# Patient Record
Sex: Male | Born: 1985 | Race: White | Hispanic: No | Marital: Single | Smoking: Never smoker
Health system: Southern US, Community
[De-identification: ages and names within clinical notes are randomized; demographics above are authoritative.]

---

## 2006-01-11 ENCOUNTER — Emergency Department (HOSPITAL_COMMUNITY): Admission: EM | Admit: 2006-01-11 | Discharge: 2006-01-11 | Payer: Self-pay | Admitting: Emergency Medicine

## 2015-05-01 ENCOUNTER — Emergency Department (HOSPITAL_COMMUNITY)
Admission: EM | Admit: 2015-05-01 | Discharge: 2015-05-01 | Disposition: A | Discharge status: Home / Self Care | Payer: Worker's Compensation | Attending: Emergency Medicine | Admitting: Emergency Medicine

## 2015-05-01 ENCOUNTER — Encounter (HOSPITAL_COMMUNITY): Payer: Self-pay

## 2015-05-01 DIAGNOSIS — S61215A Laceration without foreign body of left ring finger without damage to nail, initial encounter: Secondary | ICD-10-CM | POA: Insufficient documentation

## 2015-05-01 DIAGNOSIS — Y990 Civilian activity done for income or pay: Secondary | ICD-10-CM | POA: Insufficient documentation

## 2015-05-01 DIAGNOSIS — Y9389 Activity, other specified: Secondary | ICD-10-CM | POA: Insufficient documentation

## 2015-05-01 DIAGNOSIS — W268XXA Contact with other sharp object(s), not elsewhere classified, initial encounter: Secondary | ICD-10-CM | POA: Diagnosis not present

## 2015-05-01 DIAGNOSIS — Y9289 Other specified places as the place of occurrence of the external cause: Secondary | ICD-10-CM | POA: Insufficient documentation

## 2015-05-01 DIAGNOSIS — S61412A Laceration without foreign body of left hand, initial encounter: Secondary | ICD-10-CM

## 2015-05-01 DIAGNOSIS — S6991XA Unspecified injury of right wrist, hand and finger(s), initial encounter: Secondary | ICD-10-CM | POA: Diagnosis present

## 2015-05-01 MED ORDER — POVIDONE-IODINE 10 % EX SOLN
CUTANEOUS | Status: AC
  Administered 2015-05-01: 1
  Filled 2015-05-01: qty 118

## 2015-05-01 MED ORDER — LIDOCAINE HCL (PF) 1 % IJ SOLN
5.0000 mL | Freq: Once | INTRAMUSCULAR | Status: AC
  Administered 2015-05-01: 5 mL via INTRADERMAL
  Filled 2015-05-01: qty 5

## 2015-05-01 MED ORDER — BACITRACIN-NEOMYCIN-POLYMYXIN 400-5-5000 EX OINT
TOPICAL_OINTMENT | Freq: Once | CUTANEOUS | Status: AC
  Administered 2015-05-01: 1 via TOPICAL
  Filled 2015-05-01: qty 1

## 2015-05-01 NOTE — Discharge Instructions (Signed)

## 2015-05-01 NOTE — ED Notes (Signed)
Pt states tetanus is up to date.

## 2015-05-01 NOTE — ED Notes (Signed)
Pt cut the tip of the 4th finger of the left hand on sheet metal at work ([employer])  Bleeding controlled at this time

## 2015-05-01 NOTE — ED Notes (Signed)
MD at bedside. 

## 2015-05-01 NOTE — ED Provider Notes (Addendum)
TIME SEEN: 6:15 AM    CHIEF COMPLAINT: Left hand laceration  HPI: Pt is a 29 y.o. right-hand-dominant male with no significant past history who presents to the emergency department with a laceration to the fourth digit on the left hand that occurred 1 hour ago. States that he was at work when he cut his finger on a sheet metal. He is able to fully extend and flex the finger. No loss of sensation. No other injury. Last tetanus vaccination was 2 years ago.  ROS: See HPI Constitutional: no fever  Eyes: no drainage  ENT: no runny nose   Cardiovascular:  no chest pain  Resp: no SOB  GI: no vomiting GU: no dysuria Integumentary: no rash  Allergy: no hives  Musculoskeletal: no leg swelling  Neurological: no slurred speech ROS otherwise negative  PAST MEDICAL HISTORY/PAST SURGICAL HISTORY:  History reviewed. No pertinent past medical history.  MEDICATIONS:  Prior to Admission medications   Medication Sig Start Date End Date Taking? Authorizing Provider  diphenhydrAMINE (SOMINEX) 25 MG tablet Take 25 mg by mouth at bedtime as needed for sleep.   Yes Historical Provider, MD  ibuprofen (ADVIL,MOTRIN) 200 MG tablet Take 200 mg by mouth every 6 (six) hours as needed.   Yes Historical Provider, MD    ALLERGIES:  No Known Allergies  SOCIAL HISTORY:  Social History  Substance Use Topics  . Smoking status: Never Smoker   . Smokeless tobacco: Not on file  . Alcohol Use: No    FAMILY HISTORY: No family history on file.  EXAM: BP 149/99 mmHg  Pulse 89  Temp(Src) 98.3 F (36.8 C) (Oral)  Resp 16  Ht 5\' 10"  (1.778 m)  Wt 170 lb (77.111 kg)  BMI 24.39 kg/m2  SpO2 100% CONSTITUTIONAL: Alert and oriented and responds appropriately to questions. Well-appearing; well-nourished HEAD: Normocephalic EYES: Conjunctivae clear, PERRL ENT: normal nose; no rhinorrhea; moist mucous membranes; pharynx without lesions noted NECK: Supple, no meningismus, no LAD  CARD: RRR; S1 and S2 appreciated;  no murmurs, no clicks, no rubs, no gallops RESP: Normal chest excursion without splinting or tachypnea; breath sounds clear and equal bilaterally; no wheezes, no rhonchi, no rales, no hypoxia or respiratory distress, speaking full sentences ABD/GI: Normal bowel sounds; non-distended; soft, non-tender, no rebound, no guarding, no peritoneal signs BACK:  The back appears normal and is non-tender to palpation, there is no CVA tenderness EXT: Laceration to the left fourth digit with full range of motion in this finger with no bony deformity, normal sensation diffusely throughout this hand, 2+ radial pulse on the left, Normal ROM in all joints; otherwise extremities are non-tender to palpation; no edema; normal capillary refill; no cyanosis, no calf tenderness or swelling    SKIN: Normal color for age and race; warm; 5 cm curvilinear laceration to the ulnar aspect of the left fourth digit between the DIP and PIP without tendon involvement or bony deformity NEURO: Moves all extremities equally, sensation to light touch intact diffusely, cranial nerves II through XII intact PSYCH: The patient's mood and manner are appropriate. Grooming and personal hygiene are appropriate.  MEDICAL DECISION MAKING: Patient here with left fourth digit laceration. Tetanus is up-to-date. I have repaired patient's laceration using ten 4-0 prolene sutures. Discussed return precautions. Discussed wound care. He verbalizes understanding and is comfortable with this plan.     LACERATION REPAIR Performed by: Raelyn NumberWARD, Brae Gartman N Authorized by: Raelyn NumberWARD, Whitt Auletta N Consent: Verbal consent obtained. Risks and benefits: risks, benefits and alternatives were discussed  Consent given by: patient Patient identity confirmed: provided demographic data Prepped and Draped in normal sterile fashion Wound explored  Laceration Location: Left fourth finger  Laceration Length: 5 cm  No Foreign Bodies seen or palpated  Anesthesia: local  infiltration  Local anesthetic: lidocaine 1 % without epinephrine  Anesthetic total: 4 ml  Irrigation method: syringe Amount of cleaning: standard  Skin closure: Superficial   Number of sutures: 10   Technique: Area anesthetized using 1% lidocaine without epinephrine by digital block. Cleaned copiously with 1 L of saline and Betadine. Prepped and draped in sterile fashion. Wound repaired using 10 superficial simple interrupted 4-0 Prolene sutures. Hemostasis achieved. Bacitracin and sterile dressing applied.   Patient tolerance: Patient tolerated the procedure well with no immediate complications.    NERVE BLOCK Performed by: Raelyn Number Consent: Verbal consent obtained. Required items: required blood products, implants, devices, and special equipment available Time out: Immediately prior to procedure a "time out" was called to verify the correct patient, procedure, equipment, support staff and site/side marked as required.  Indication: Laceration repair  Nerve block body site: Left fourth digit   Preparation: Patient was prepped and draped in the usual sterile fashion. Needle gauge: 24 G Location technique: anatomical landmarks  Local anesthetic: 1% lidocaine without epinephrine   Anesthetic total: 4 ml  Outcome: pain improved Patient tolerance: Patient tolerated the procedure well with no immediate complications.      Layla Maw Cornell Gaber, DO 05/01/15 0719  Layla Maw Conlin Brahm, DO 05/01/15 1610

## 2015-11-04 ENCOUNTER — Telehealth: Payer: Self-pay | Admitting: Geriatric Medicine

## 2015-11-04 NOTE — Telephone Encounter (Signed)
Patient needs refills on his Adderalls, ok? Please advise.

## 2015-11-04 NOTE — Telephone Encounter (Signed)
This Derek Tran is not a pt here -?wrong chart Thx

## 2015-11-05 NOTE — Telephone Encounter (Signed)
Sorry, I selected the wrong patient. I will send the correct one.

## 2017-04-26 ENCOUNTER — Ambulatory Visit (INDEPENDENT_AMBULATORY_CARE_PROVIDER_SITE_OTHER): Payer: Self-pay | Admitting: Orthopaedic Surgery

## 2017-04-26 ENCOUNTER — Encounter (INDEPENDENT_AMBULATORY_CARE_PROVIDER_SITE_OTHER): Payer: Self-pay

## 2017-04-26 ENCOUNTER — Ambulatory Visit (INDEPENDENT_AMBULATORY_CARE_PROVIDER_SITE_OTHER): Payer: Self-pay

## 2017-04-26 ENCOUNTER — Encounter (INDEPENDENT_AMBULATORY_CARE_PROVIDER_SITE_OTHER): Payer: Self-pay | Admitting: Orthopaedic Surgery

## 2017-04-26 VITALS — BP 155/103 | HR 96 | Resp 16 | Ht 70.0 in | Wt 175.0 lb

## 2017-04-26 DIAGNOSIS — M25571 Pain in right ankle and joints of right foot: Secondary | ICD-10-CM

## 2017-04-26 NOTE — Progress Notes (Signed)
Office Visit Note   Patient: Derek Tran           Date of Birth: 07-31-1985           MRN: [id number] Visit Date: 04/26/2017              Requested by: No referring provider defined for this encounter. PCP: Patient, No Pcp Per   Assessment & Plan: Visit Diagnoses:  1. Pain in right ankle and joints of right foot     Plan: Sprain right midfoot with possible ligamentous avulsion talus. Long discussion regarding different treatment options. Because of his insurance status she preferred just use a regular shoe and crutches. We will give him a note regarding no work until November 9.  Follow-Up Instructions: Return if symptoms worsen or fail to improve.   Orders:  Orders Placed This Encounter  Procedures  . XR Foot Complete Right   No orders of the defined types were placed in this encounter.     Procedures: No procedures performed   Clinical Data: No additional findings.   Subjective: Chief Complaint  Patient presents with  . Right Ankle - Pain, Edema    Derek Tran is a 31 y o that presents with a Right ankle sprain/? He missed the last step and rolled foot laterally- heard a loud "pop" on October 18,2018.  She had a [facility] emergency room last Thursday after the injury. X-rays were negative for any fracture. Films of the ankle. Resume having pain predominantly in the dorsum of his right foot. He did describe an injury where there appeared to be an inversion stress to his ankle as he was using stairs in his home. Still having some difficulty bearing weight because of pain in the midfoot. No numbness or tingling  HPI  Review of Systems  Constitutional: Negative for fatigue.  HENT: Negative for hearing loss.   Respiratory: Negative for apnea, chest tightness and shortness of breath.   Cardiovascular: Negative for chest pain, palpitations and leg swelling.  Gastrointestinal: Negative for blood in stool, constipation and diarrhea.  Genitourinary: Negative  for difficulty urinating.  Musculoskeletal: Positive for arthralgias and joint swelling. Negative for back pain, myalgias, neck pain and neck stiffness.  Neurological: Positive for headaches. Negative for weakness and numbness.  Hematological: Does not bruise/bleed easily.  Psychiatric/Behavioral: Positive for sleep disturbance. The patient is not nervous/anxious.      Objective: Vital Signs: BP (!) 155/103   Pulse 96   Resp 16   Ht 5\' 10"  (1.778 m)   Wt 175 lb (79.4 kg)   BMI 25.11 kg/m   Physical Exam  Ortho Exam right foot without any ankle pain or swelling or ecchymosis. No pain over either malleolus and. No pain over the anterior talofibular and fibulocalcaneal ligaments or deltoid ligament.  Moderate edema in the dorsum of the foot was some local tenderness in the midfoot more lateral than medial. Next resolving ecchymosis of all the toes but without pain. Skin otherwise intact. No plantar pain. No Achilles pain.  Specialty Comments:  No specialty comments available.  Imaging: Xr Foot Complete Right  Result Date: 04/26/2017 Films of the right foot were obtained in 3 projections as his pain now is localized to the dorsum of his foot. Compared to the films on the PACS system from the right ankle where there is an os perineum. No obvious fracture. Tarsal joints are intact. No soft tissue calcification except the dorsum of the distal talus which could represent  a small avulsion fracture    PMFS History: There are no active problems to display for this patient.  No past medical history on file.  No family history on file.  No past surgical history on file. Social History   Occupational History  . Not on file.   Social History Main Topics  . Smoking status: Never Smoker  . Smokeless tobacco: Never Used  . Alcohol use No  . Drug use: No  . Sexual activity: Not on file

## 2017-08-05 ENCOUNTER — Emergency Department (HOSPITAL_COMMUNITY)
Admission: EM | Admit: 2017-08-05 | Discharge: 2017-08-05 | Disposition: A | Discharge status: Home / Self Care | Payer: Self-pay | Attending: Emergency Medicine | Admitting: Emergency Medicine

## 2017-08-05 ENCOUNTER — Emergency Department (HOSPITAL_COMMUNITY): Payer: Self-pay

## 2017-08-05 ENCOUNTER — Other Ambulatory Visit: Payer: Self-pay

## 2017-08-05 ENCOUNTER — Encounter (HOSPITAL_COMMUNITY): Payer: Self-pay

## 2017-08-05 DIAGNOSIS — G8929 Other chronic pain: Secondary | ICD-10-CM | POA: Insufficient documentation

## 2017-08-05 DIAGNOSIS — M25571 Pain in right ankle and joints of right foot: Secondary | ICD-10-CM | POA: Insufficient documentation

## 2017-08-05 DIAGNOSIS — Z79899 Other long term (current) drug therapy: Secondary | ICD-10-CM | POA: Insufficient documentation

## 2017-08-05 NOTE — ED Provider Notes (Signed)
[facility] EMERGENCY DEPARTMENT Provider Note   CSN: [id number] Arrival date & time: 08/05/17  [id number]     History   Chief Complaint Chief Complaint  Patient presents with  . Foot Pain    RIGHT    HPI Derek Tran is a 32 y.o. male.  HPI Patient presents to the emergency room for evaluation of persistent ankle and foot pain.  Patient states he rolled his ankle about 3 months ago when he was carrying his child.  He heard a pop.  He was evaluated at [facility] at the time of the injury and was told the x-rays were negative.  When the patient initially injured his ankle he did ended up developing a large amount of bruising and swelling.  He ended up following up with an orthopedic doctor.  He was instructed that he had a severe ankle sprain.  Patient states his symptoms improved but he continues to have pain and discomfort in his ankles especially when walking a lot.  He denies any recurrent injuries. History reviewed. No pertinent past medical history.  There are no active problems to display for this patient.   History reviewed. No pertinent surgical history.     Home Medications    Prior to Admission medications   Medication Sig Start Date End Date Taking? Authorizing Provider  diphenhydrAMINE (SOMINEX) 25 MG tablet Take 25 mg by mouth at bedtime as needed for sleep.    [provider]  ibuprofen (ADVIL,MOTRIN) 200 MG tablet Take 200 mg by mouth every 6 (six) hours as needed.    [provider]    Family History No family history on file.  Social History Social History   Tobacco Use  . Smoking status: Never Smoker  . Smokeless tobacco: Never Used  Substance Use Topics  . Alcohol use: No  . Drug use: No     Allergies   Patient has no known allergies.   Review of Systems Review of Systems  All other systems reviewed and are negative.    Physical Exam Updated Vital Signs BP (!) 160/97 (BP Location: Right Arm)   Pulse (!) 102    Temp 98.2 F (36.8 C) (Oral)   Resp 18   Ht 1.778 m (5\' 10" )   Wt 77.1 kg (170 lb)   SpO2 98%   BMI 24.39 kg/m   Physical Exam  Constitutional: He appears well-developed and well-nourished. No distress.  HENT:  Head: Normocephalic and atraumatic.  Right Ear: External ear normal.  Left Ear: External ear normal.  Eyes: Conjunctivae are normal. Right eye exhibits no discharge. Left eye exhibits no discharge. No scleral icterus.  Neck: Neck supple. No tracheal deviation present.  Cardiovascular: Normal rate.  Pulmonary/Chest: Effort normal. No stridor. No respiratory distress.  Abdominal: He exhibits no distension.  Musculoskeletal: He exhibits no edema or deformity.       Right knee: Normal.       Right ankle: Tenderness. No lateral malleolus, no medial malleolus, no head of 5th metatarsal and no proximal fibula tenderness found.       Right foot: There is tenderness and bony tenderness. There is no swelling and no deformity.  Neurological: He is alert. Cranial nerve deficit: no gross deficits.  Skin: Skin is warm and dry. No rash noted.  Psychiatric: He has a normal mood and affect.  Nursing note and vitals reviewed.    ED Treatments / Results    Radiology Dg Ankle Complete Right  Result Date: 08/05/2017  CLINICAL DATA:  Right foot/ankle injury 3 months ago EXAM: RIGHT ANKLE - COMPLETE 3+ VIEW COMPARISON:  04/20/2017 FINDINGS: No fracture or dislocation is seen. The ankle mortise is intact. The base of the fifth metatarsal is unremarkable. IMPRESSION: Negative. Electronically Signed   By: Charline Bills M.D.   On: 08/05/2017 09:43   Dg Foot Complete Right  Result Date: 08/05/2017 CLINICAL DATA:  Right foot/ankle injury 3 months ago EXAM: RIGHT FOOT COMPLETE - 3+ VIEW COMPARISON:  None. FINDINGS: No fracture or dislocation is seen. The joint spaces are preserved. Visualized soft tissues are within normal limits. IMPRESSION: Negative. Electronically Signed   By: Charline Bills  M.D.   On: 08/05/2017 09:42    Procedures Procedures (including critical care time)  Medications Ordered in ED Medications - No data to display   Initial Impression / Assessment and Plan / ED Course  I have reviewed the triage vital signs and the nursing notes.  Pertinent labs & imaging results that were available during my care of the patient were reviewed by me and considered in my medical decision making (see chart for details).   Patient has been having persistent ankle pain after an injury several months ago.  No evidence of swelling or abnormality on exam.  X-rays do not show any evidence of occult fracture.  Patient may be having persistent pain from ankle instability associated with his prior sprain.  I will give the patient an ankle brace.  I recommend he contact his orthopedic doctor for further treatment and evaluation. Final Clinical Impressions(s) / ED Diagnoses   Final diagnoses:  Chronic pain of right ankle    ED Discharge Orders    None       Linwood Dibbles, MD 08/05/17 1026

## 2017-08-05 NOTE — ED Triage Notes (Signed)
Patient reports of rolling ankle/foot 3 months ago was seen at [facility]/ then ortho and told no fx. States walking at work is making pain worse. Denies new injury.

## 2017-08-05 NOTE — Discharge Instructions (Signed)
Take over-the-counter medications as needed for pain, wear the ankle brace to help support your ankle, follow-up with your orthopedic doctor as we discussed for further treatment and evaluation

## 2017-09-06 DIAGNOSIS — S93491A Sprain of other ligament of right ankle, initial encounter: Secondary | ICD-10-CM | POA: Diagnosis not present

## 2017-09-06 DIAGNOSIS — M79671 Pain in right foot: Secondary | ICD-10-CM | POA: Diagnosis not present

## 2017-09-19 DIAGNOSIS — E782 Mixed hyperlipidemia: Secondary | ICD-10-CM | POA: Diagnosis not present

## 2017-09-19 DIAGNOSIS — I10 Essential (primary) hypertension: Secondary | ICD-10-CM | POA: Diagnosis not present

## 2017-09-19 DIAGNOSIS — Z6827 Body mass index (BMI) 27.0-27.9, adult: Secondary | ICD-10-CM | POA: Diagnosis not present

## 2017-10-04 DIAGNOSIS — M79671 Pain in right foot: Secondary | ICD-10-CM | POA: Diagnosis not present

## 2017-10-04 DIAGNOSIS — S93491D Sprain of other ligament of right ankle, subsequent encounter: Secondary | ICD-10-CM | POA: Diagnosis not present

## 2017-11-09 DIAGNOSIS — S93491D Sprain of other ligament of right ankle, subsequent encounter: Secondary | ICD-10-CM | POA: Diagnosis not present

## 2017-11-09 DIAGNOSIS — M79671 Pain in right foot: Secondary | ICD-10-CM | POA: Diagnosis not present

## 2017-11-14 DIAGNOSIS — M79671 Pain in right foot: Secondary | ICD-10-CM | POA: Diagnosis not present

## 2017-11-16 DIAGNOSIS — S93491D Sprain of other ligament of right ankle, subsequent encounter: Secondary | ICD-10-CM | POA: Diagnosis not present

## 2017-11-16 DIAGNOSIS — M79671 Pain in right foot: Secondary | ICD-10-CM | POA: Diagnosis not present

## 2017-12-07 DIAGNOSIS — G5750 Tarsal tunnel syndrome, unspecified lower limb: Secondary | ICD-10-CM | POA: Diagnosis not present

## 2017-12-07 DIAGNOSIS — M79671 Pain in right foot: Secondary | ICD-10-CM | POA: Diagnosis not present

## 2018-01-09 DIAGNOSIS — Z6826 Body mass index (BMI) 26.0-26.9, adult: Secondary | ICD-10-CM | POA: Diagnosis not present

## 2018-01-09 DIAGNOSIS — M79671 Pain in right foot: Secondary | ICD-10-CM | POA: Diagnosis not present

## 2018-02-19 DIAGNOSIS — S93491A Sprain of other ligament of right ankle, initial encounter: Secondary | ICD-10-CM | POA: Diagnosis not present

## 2018-02-19 DIAGNOSIS — M7661 Achilles tendinitis, right leg: Secondary | ICD-10-CM | POA: Diagnosis not present

## 2018-02-19 DIAGNOSIS — M79671 Pain in right foot: Secondary | ICD-10-CM | POA: Diagnosis not present

## 2018-02-21 DIAGNOSIS — R2689 Other abnormalities of gait and mobility: Secondary | ICD-10-CM | POA: Diagnosis not present

## 2018-02-21 DIAGNOSIS — M7661 Achilles tendinitis, right leg: Secondary | ICD-10-CM | POA: Diagnosis not present

## 2018-02-28 DIAGNOSIS — R2689 Other abnormalities of gait and mobility: Secondary | ICD-10-CM | POA: Diagnosis not present

## 2018-02-28 DIAGNOSIS — M7661 Achilles tendinitis, right leg: Secondary | ICD-10-CM | POA: Diagnosis not present

## 2018-03-01 DIAGNOSIS — R2689 Other abnormalities of gait and mobility: Secondary | ICD-10-CM | POA: Diagnosis not present

## 2018-03-01 DIAGNOSIS — M7661 Achilles tendinitis, right leg: Secondary | ICD-10-CM | POA: Diagnosis not present

## 2018-03-06 DIAGNOSIS — R2689 Other abnormalities of gait and mobility: Secondary | ICD-10-CM | POA: Diagnosis not present

## 2018-03-06 DIAGNOSIS — M7661 Achilles tendinitis, right leg: Secondary | ICD-10-CM | POA: Diagnosis not present

## 2018-03-13 DIAGNOSIS — M7661 Achilles tendinitis, right leg: Secondary | ICD-10-CM | POA: Diagnosis not present

## 2018-03-13 DIAGNOSIS — R2689 Other abnormalities of gait and mobility: Secondary | ICD-10-CM | POA: Diagnosis not present

## 2018-03-26 DIAGNOSIS — M7661 Achilles tendinitis, right leg: Secondary | ICD-10-CM | POA: Diagnosis not present

## 2018-03-26 DIAGNOSIS — R2689 Other abnormalities of gait and mobility: Secondary | ICD-10-CM | POA: Diagnosis not present

## 2018-03-29 DIAGNOSIS — R2689 Other abnormalities of gait and mobility: Secondary | ICD-10-CM | POA: Diagnosis not present

## 2018-03-29 DIAGNOSIS — M7661 Achilles tendinitis, right leg: Secondary | ICD-10-CM | POA: Diagnosis not present

## 2018-04-13 DIAGNOSIS — M7661 Achilles tendinitis, right leg: Secondary | ICD-10-CM | POA: Diagnosis not present

## 2018-04-13 DIAGNOSIS — R2689 Other abnormalities of gait and mobility: Secondary | ICD-10-CM | POA: Diagnosis not present

## 2018-04-18 DIAGNOSIS — R2689 Other abnormalities of gait and mobility: Secondary | ICD-10-CM | POA: Diagnosis not present

## 2018-04-18 DIAGNOSIS — M7661 Achilles tendinitis, right leg: Secondary | ICD-10-CM | POA: Diagnosis not present

## 2018-04-20 DIAGNOSIS — M7661 Achilles tendinitis, right leg: Secondary | ICD-10-CM | POA: Diagnosis not present

## 2018-04-20 DIAGNOSIS — R2689 Other abnormalities of gait and mobility: Secondary | ICD-10-CM | POA: Diagnosis not present

## 2018-04-23 DIAGNOSIS — M25571 Pain in right ankle and joints of right foot: Secondary | ICD-10-CM | POA: Diagnosis not present

## 2018-04-23 DIAGNOSIS — S93491A Sprain of other ligament of right ankle, initial encounter: Secondary | ICD-10-CM | POA: Diagnosis not present

## 2018-04-23 DIAGNOSIS — M79671 Pain in right foot: Secondary | ICD-10-CM | POA: Diagnosis not present

## 2018-04-23 DIAGNOSIS — M7661 Achilles tendinitis, right leg: Secondary | ICD-10-CM | POA: Diagnosis not present

## 2018-05-03 DIAGNOSIS — M25571 Pain in right ankle and joints of right foot: Secondary | ICD-10-CM | POA: Diagnosis not present

## 2018-05-09 DIAGNOSIS — M25571 Pain in right ankle and joints of right foot: Secondary | ICD-10-CM | POA: Diagnosis not present

## 2018-05-09 DIAGNOSIS — D7589 Other specified diseases of blood and blood-forming organs: Secondary | ICD-10-CM | POA: Diagnosis not present

## 2018-06-27 IMAGING — DX DG FOOT COMPLETE 3+V*R*
3 series · 3 of 3 positions shown · non-contrast
Comparison: None.

CLINICAL DATA: Right foot/ankle injury 3 months ago

EXAM:
RIGHT FOOT COMPLETE - 3+ VIEW

[foot ap]
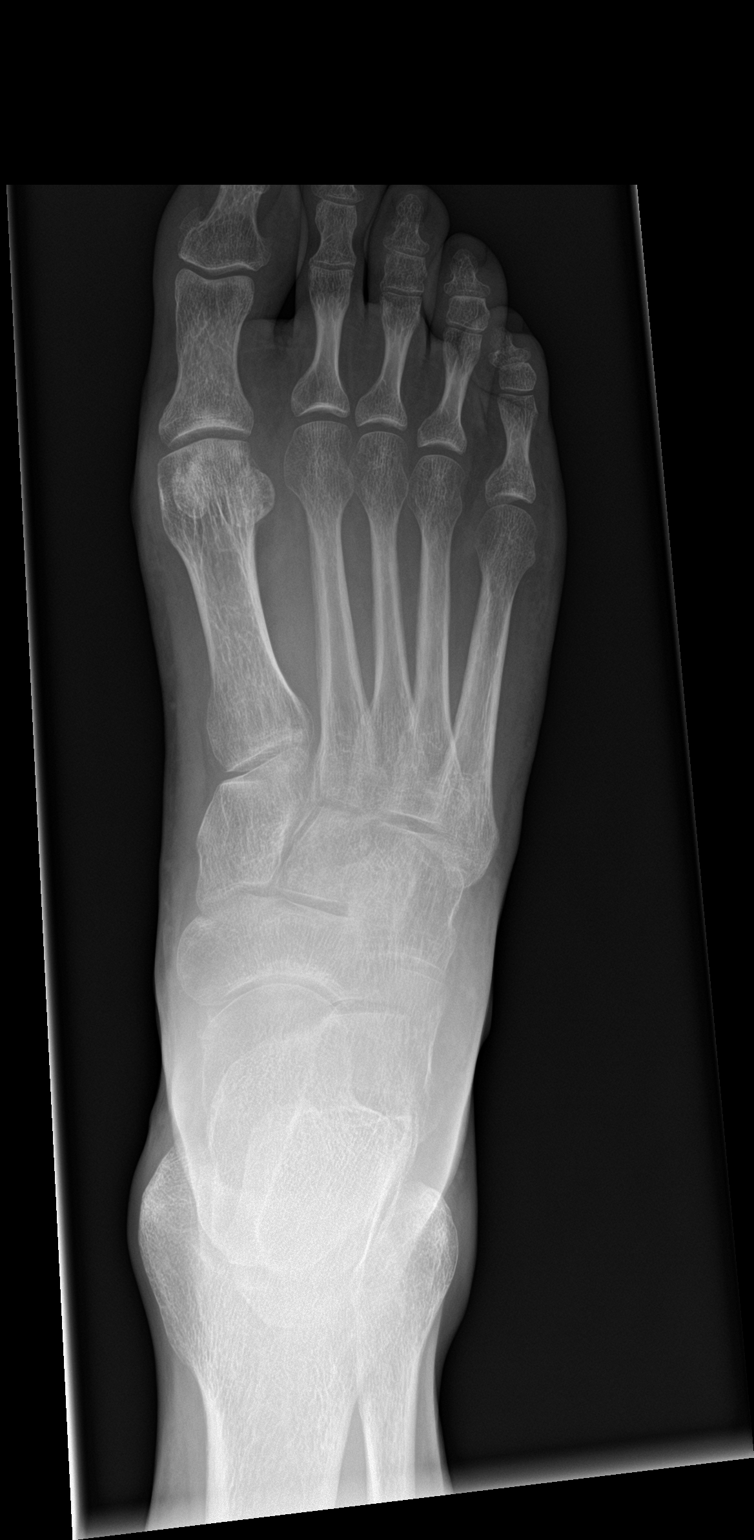

[foot obl]
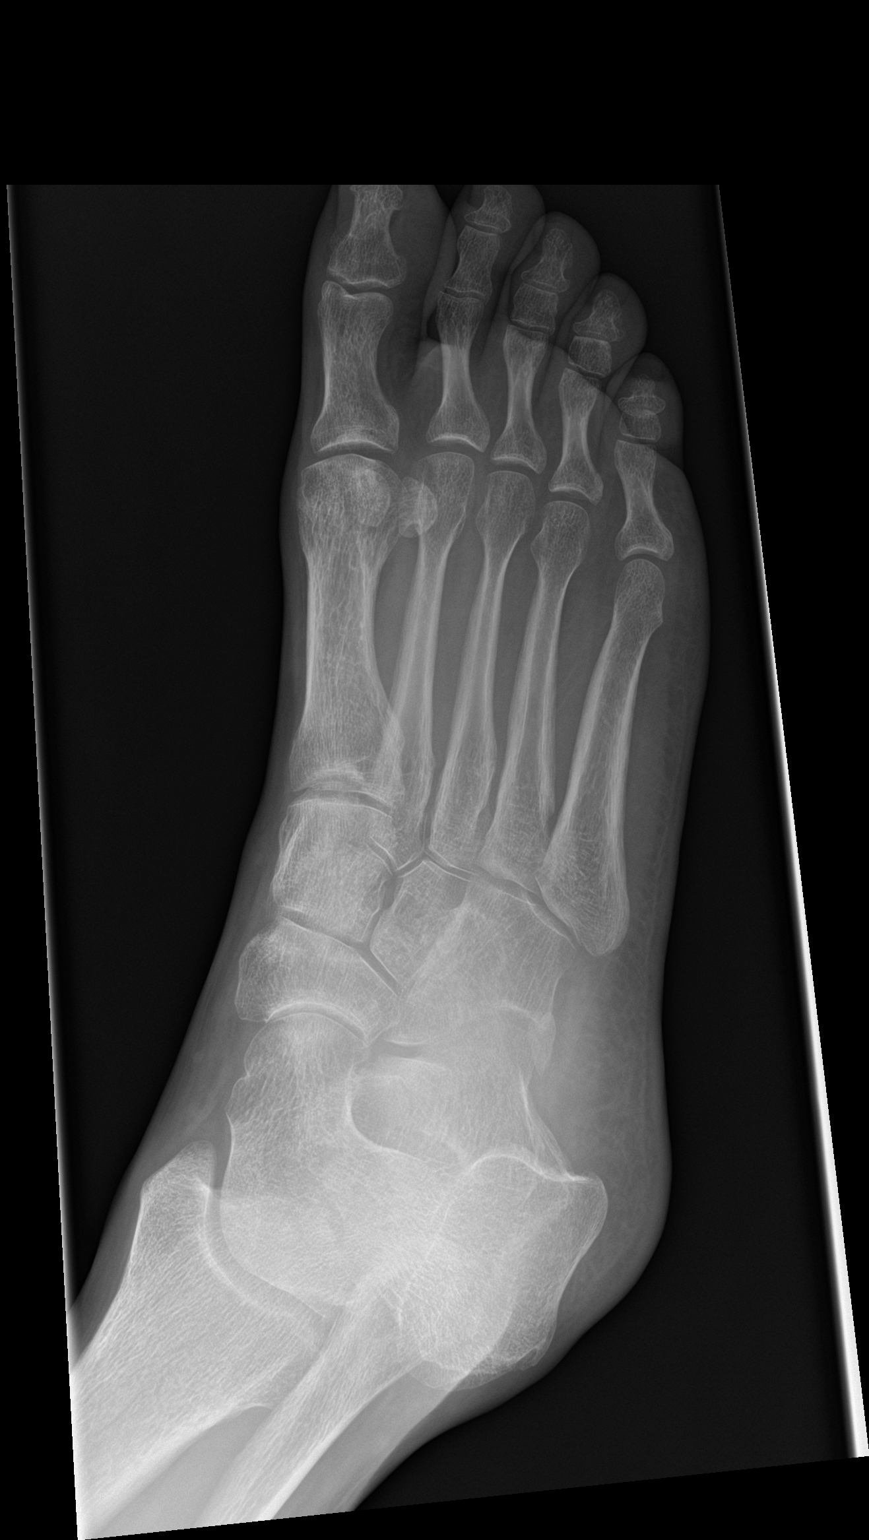

[foot lat]
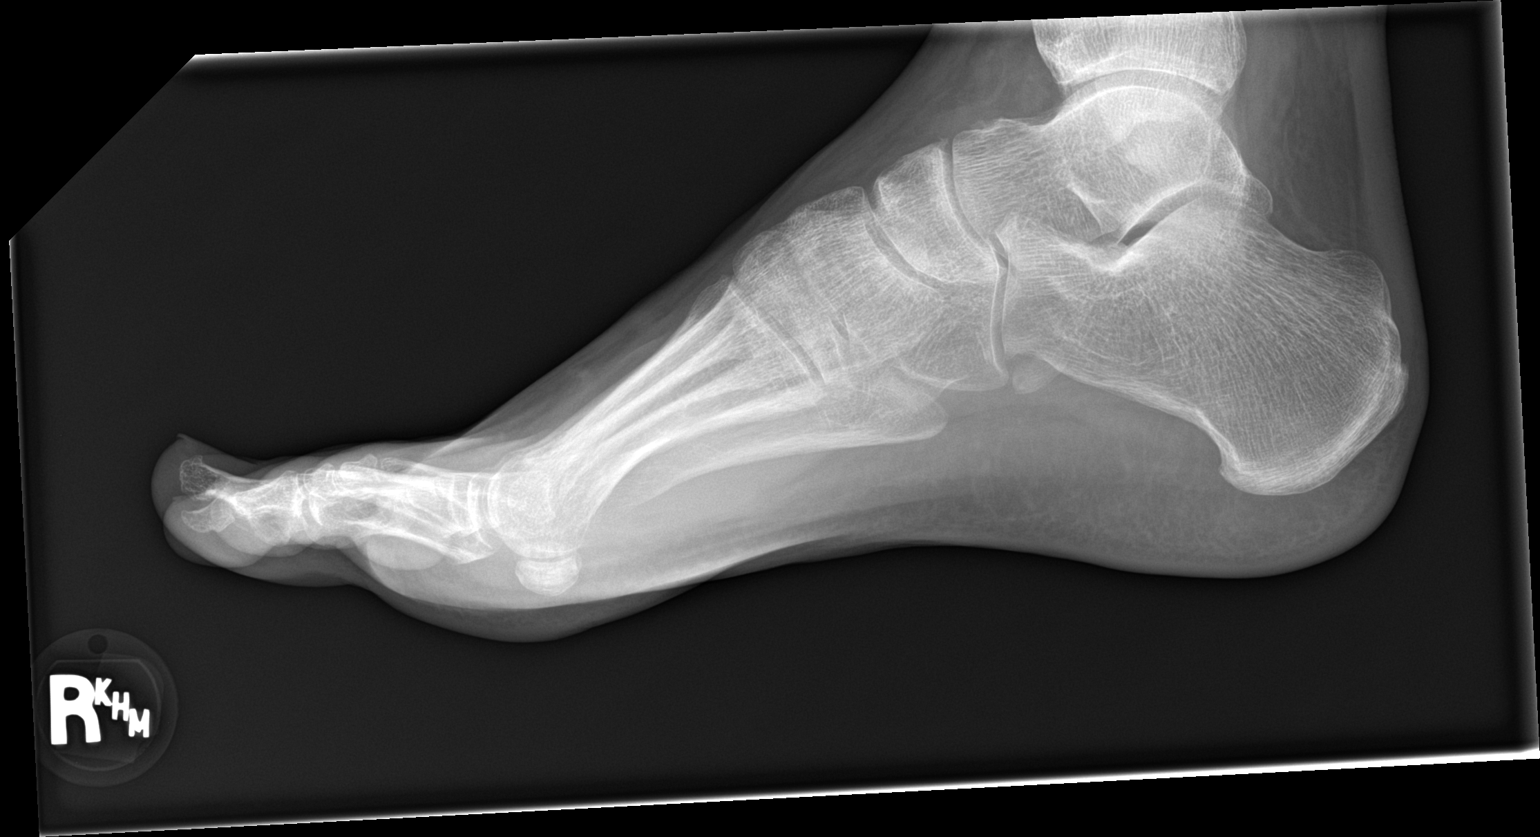

[3 of 3 positions shown; findings below may reference images not displayed]

FINDINGS: No fracture or dislocation is seen.

The joint spaces are preserved.

Visualized soft tissues are within normal limits.
IMPRESSION: Negative.

## 2018-09-26 DIAGNOSIS — M7661 Achilles tendinitis, right leg: Secondary | ICD-10-CM | POA: Diagnosis not present

## 2018-09-26 DIAGNOSIS — M25571 Pain in right ankle and joints of right foot: Secondary | ICD-10-CM | POA: Diagnosis not present

## 2019-02-20 DIAGNOSIS — M7661 Achilles tendinitis, right leg: Secondary | ICD-10-CM | POA: Diagnosis not present

## 2019-02-20 DIAGNOSIS — M25571 Pain in right ankle and joints of right foot: Secondary | ICD-10-CM | POA: Diagnosis not present

## 2019-02-26 DIAGNOSIS — M7661 Achilles tendinitis, right leg: Secondary | ICD-10-CM | POA: Diagnosis not present

## 2019-02-26 DIAGNOSIS — M79604 Pain in right leg: Secondary | ICD-10-CM | POA: Diagnosis not present

## 2019-06-20 DIAGNOSIS — R05 Cough: Secondary | ICD-10-CM | POA: Diagnosis not present

## 2019-06-20 DIAGNOSIS — J209 Acute bronchitis, unspecified: Secondary | ICD-10-CM | POA: Diagnosis not present

## 2019-06-20 DIAGNOSIS — Z20828 Contact with and (suspected) exposure to other viral communicable diseases: Secondary | ICD-10-CM | POA: Diagnosis not present

## 2019-06-20 DIAGNOSIS — J029 Acute pharyngitis, unspecified: Secondary | ICD-10-CM | POA: Diagnosis not present

## 2019-06-20 DIAGNOSIS — J019 Acute sinusitis, unspecified: Secondary | ICD-10-CM | POA: Diagnosis not present

## 2019-06-20 DIAGNOSIS — U071 COVID-19: Secondary | ICD-10-CM | POA: Diagnosis not present

## 2019-10-29 DIAGNOSIS — R350 Frequency of micturition: Secondary | ICD-10-CM | POA: Diagnosis not present

## 2019-10-29 DIAGNOSIS — N400 Enlarged prostate without lower urinary tract symptoms: Secondary | ICD-10-CM | POA: Diagnosis not present

## 2019-10-29 DIAGNOSIS — Z6827 Body mass index (BMI) 27.0-27.9, adult: Secondary | ICD-10-CM | POA: Diagnosis not present

## 2019-11-20 DIAGNOSIS — M7661 Achilles tendinitis, right leg: Secondary | ICD-10-CM | POA: Diagnosis not present

## 2019-11-20 DIAGNOSIS — M25571 Pain in right ankle and joints of right foot: Secondary | ICD-10-CM | POA: Diagnosis not present

## 2019-12-12 DIAGNOSIS — M25571 Pain in right ankle and joints of right foot: Secondary | ICD-10-CM | POA: Diagnosis not present

## 2019-12-16 DIAGNOSIS — M7661 Achilles tendinitis, right leg: Secondary | ICD-10-CM | POA: Diagnosis not present

## 2020-07-05 ENCOUNTER — Ambulatory Visit: Admission: EM | Admit: 2020-07-05 | Discharge: 2020-07-05 | Discharge status: Absent without leave | Payer: Self-pay
